# Patient Record
Sex: Female | Born: 1975 | Race: Black or African American | Hispanic: No | Marital: Single | Smoking: Current every day smoker
Health system: Southern US, Community
[De-identification: ages and names within clinical notes are randomized; demographics above are authoritative.]

---

## 2003-05-19 ENCOUNTER — Emergency Department (HOSPITAL_COMMUNITY): Admission: EM | Admit: 2003-05-19 | Discharge: 2003-05-19 | Payer: Self-pay | Admitting: Emergency Medicine

## 2004-01-30 ENCOUNTER — Emergency Department (HOSPITAL_COMMUNITY): Admission: EM | Admit: 2004-01-30 | Discharge: 2004-01-30 | Payer: Self-pay | Admitting: Emergency Medicine

## 2004-06-11 ENCOUNTER — Emergency Department (HOSPITAL_COMMUNITY): Admission: EM | Admit: 2004-06-11 | Discharge: 2004-06-11 | Payer: Self-pay | Admitting: Emergency Medicine

## 2004-06-12 ENCOUNTER — Encounter: Admission: RE | Admit: 2004-06-12 | Discharge: 2004-06-12 | Payer: Self-pay | Admitting: Emergency Medicine

## 2004-06-24 ENCOUNTER — Encounter: Payer: Self-pay | Admitting: Emergency Medicine

## 2004-06-24 ENCOUNTER — Encounter: Admission: RE | Admit: 2004-06-24 | Discharge: 2004-06-24 | Payer: Self-pay | Admitting: Pediatrics

## 2004-07-14 ENCOUNTER — Emergency Department (HOSPITAL_COMMUNITY): Admission: EM | Admit: 2004-07-14 | Discharge: 2004-07-14 | Payer: Self-pay | Admitting: Emergency Medicine

## 2006-04-04 ENCOUNTER — Emergency Department (HOSPITAL_COMMUNITY): Admission: EM | Admit: 2006-04-04 | Discharge: 2006-04-04 | Payer: Self-pay | Admitting: Family Medicine

## 2006-07-27 ENCOUNTER — Emergency Department (HOSPITAL_COMMUNITY): Admission: EM | Admit: 2006-07-27 | Discharge: 2006-07-27 | Payer: Self-pay | Admitting: Emergency Medicine

## 2007-03-06 ENCOUNTER — Emergency Department (HOSPITAL_COMMUNITY): Admission: EM | Admit: 2007-03-06 | Discharge: 2007-03-06 | Payer: Self-pay | Admitting: Family Medicine

## 2007-03-11 ENCOUNTER — Emergency Department (HOSPITAL_COMMUNITY): Admission: EM | Admit: 2007-03-11 | Discharge: 2007-03-11 | Payer: Self-pay | Admitting: Family Medicine

## 2007-10-20 ENCOUNTER — Encounter: Admission: RE | Admit: 2007-10-20 | Discharge: 2007-10-20 | Payer: Self-pay | Admitting: Emergency Medicine

## 2008-02-10 ENCOUNTER — Emergency Department (HOSPITAL_COMMUNITY): Admission: EM | Admit: 2008-02-10 | Discharge: 2008-02-10 | Payer: Self-pay | Admitting: Emergency Medicine

## 2008-07-07 ENCOUNTER — Emergency Department (HOSPITAL_COMMUNITY): Admission: EM | Admit: 2008-07-07 | Discharge: 2008-07-07 | Payer: Self-pay | Admitting: Emergency Medicine

## 2008-07-08 ENCOUNTER — Ambulatory Visit: Payer: Self-pay | Admitting: *Deleted

## 2008-07-08 ENCOUNTER — Inpatient Hospital Stay (HOSPITAL_COMMUNITY): Admission: AD | Admit: 2008-07-08 | Discharge: 2008-07-11 | Payer: Self-pay | Admitting: *Deleted

## 2009-09-02 ENCOUNTER — Emergency Department (HOSPITAL_COMMUNITY): Admission: EM | Admit: 2009-09-02 | Discharge: 2009-09-03 | Payer: Self-pay | Admitting: Emergency Medicine

## 2010-05-13 ENCOUNTER — Emergency Department (HOSPITAL_COMMUNITY): Admission: EM | Admit: 2010-05-13 | Discharge: 2010-05-13 | Payer: Self-pay | Admitting: Emergency Medicine

## 2010-11-19 LAB — URINE CULTURE
Colony Count: >=100000
Culture  Setup Time: 201109072310

## 2010-11-19 LAB — POCT URINALYSIS DIPSTICK
Bilirubin Urine: NEGATIVE
Glucose, UA: NEGATIVE mg/dL
Ketones, ur: NEGATIVE mg/dL
Nitrite: POSITIVE — AB
Protein, ur: NEGATIVE mg/dL
Specific Gravity, Urine: 1.01 (ref 1.005–1.030)
Urobilinogen, UA: 1 mg/dL (ref 0.0–1.0)
pH: 5 (ref 5.0–8.0)

## 2010-12-07 LAB — RAPID URINE DRUG SCREEN, HOSP PERFORMED
Amphetamines: POSITIVE — AB
Barbiturates: NOT DETECTED
Benzodiazepines: NOT DETECTED
Cocaine: POSITIVE — AB
Opiates: NOT DETECTED
Tetrahydrocannabinol: POSITIVE — AB

## 2010-12-07 LAB — URINE MICROSCOPIC-ADD ON

## 2010-12-07 LAB — CBC
HCT: 42.3 % (ref 36.0–46.0)
Hemoglobin: 14.4 g/dL (ref 12.0–15.0)
MCHC: 34 g/dL (ref 30.0–36.0)
MCV: 94.1 fL (ref 78.0–100.0)
Platelets: 347 10*3/uL (ref 150–400)
RBC: 4.5 MIL/uL (ref 3.87–5.11)
RDW: 14.4 % (ref 11.5–15.5)
WBC: 14.2 10*3/uL — ABNORMAL HIGH (ref 4.0–10.5)

## 2010-12-07 LAB — COMPREHENSIVE METABOLIC PANEL
ALT: 16 U/L (ref 0–35)
AST: 30 U/L (ref 0–37)
Albumin: 4.3 g/dL (ref 3.5–5.2)
Alkaline Phosphatase: 84 U/L (ref 39–117)
BUN: 7 mg/dL (ref 6–23)
CO2: 20 mEq/L (ref 19–32)
Calcium: 9.4 mg/dL (ref 8.4–10.5)
Chloride: 106 mEq/L (ref 96–112)
Creatinine, Ser: 1 mg/dL (ref 0.4–1.2)
GFR calc Af Amer: >60 mL/min (ref >60)
GFR calc non Af Amer: >60 mL/min (ref >60)
Glucose, Bld: 138 mg/dL — ABNORMAL HIGH (ref 70–99)
Potassium: 3.7 mEq/L (ref 3.5–5.1)
Sodium: 139 mEq/L (ref 135–145)
Total Bilirubin: 0.3 mg/dL (ref 0.3–1.2)
Total Protein: 8.3 g/dL (ref 6.0–8.3)

## 2010-12-07 LAB — URINALYSIS, ROUTINE W REFLEX MICROSCOPIC
Bilirubin Urine: NEGATIVE
Glucose, UA: NEGATIVE mg/dL
Ketones, ur: NEGATIVE mg/dL
Leukocytes, UA: NEGATIVE
Nitrite: NEGATIVE
Protein, ur: NEGATIVE mg/dL
Specific Gravity, Urine: 1.024 (ref 1.005–1.030)
Urobilinogen, UA: 0.2 mg/dL (ref 0.0–1.0)
pH: 6 (ref 5.0–8.0)

## 2010-12-07 LAB — DIFFERENTIAL
Basophils Absolute: 0 10*3/uL (ref 0.0–0.1)
Basophils Relative: 0 % (ref 0–1)
Eosinophils Absolute: 0 10*3/uL (ref 0.0–0.7)
Eosinophils Relative: 0 % (ref 0–5)
Lymphocytes Relative: 7 % — ABNORMAL LOW (ref 12–46)
Lymphs Abs: 0.9 10*3/uL (ref 0.7–4.0)
Monocytes Absolute: 0.5 10*3/uL (ref 0.1–1.0)
Monocytes Relative: 4 % (ref 3–12)
Neutro Abs: 12.8 10*3/uL — ABNORMAL HIGH (ref 1.7–7.7)
Neutrophils Relative %: 90 % — ABNORMAL HIGH (ref 43–77)

## 2010-12-07 LAB — ETHANOL: Alcohol, Ethyl (B): <5 mg/dL (ref 0–10)

## 2010-12-07 LAB — PREGNANCY, URINE: Preg Test, Ur: NEGATIVE
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
## 2011-05-11 ENCOUNTER — Inpatient Hospital Stay (INDEPENDENT_AMBULATORY_CARE_PROVIDER_SITE_OTHER)
Admission: RE | Admit: 2011-05-11 | Discharge: 2011-05-11 | Disposition: A | Discharge status: Home / Self Care | Payer: 59 | Source: Ambulatory Visit | Attending: Family Medicine | Admitting: Family Medicine

## 2011-05-11 DIAGNOSIS — N390 Urinary tract infection, site not specified: Secondary | ICD-10-CM

## 2011-05-11 LAB — POCT URINALYSIS DIP (DEVICE)
Glucose, UA: NEGATIVE mg/dL
Ketones, ur: NEGATIVE mg/dL
Nitrite: NEGATIVE
Specific Gravity, Urine: >=1.03 (ref 1.005–1.030)
Urobilinogen, UA: 2 mg/dL — ABNORMAL HIGH (ref 0.0–1.0)
pH: 6.5 (ref 5.0–8.0)

## 2011-05-11 LAB — URINE MICROSCOPIC-ADD ON

## 2011-05-11 LAB — POCT PREGNANCY, URINE: Preg Test, Ur: NEGATIVE

## 2011-05-11 LAB — URINALYSIS, ROUTINE W REFLEX MICROSCOPIC
Glucose, UA: NEGATIVE mg/dL
Ketones, ur: NEGATIVE mg/dL
Nitrite: NEGATIVE
Protein, ur: NEGATIVE mg/dL
Specific Gravity, Urine: 1.028 (ref 1.005–1.030)
Urobilinogen, UA: 1 mg/dL (ref 0.0–1.0)
pH: 6 (ref 5.0–8.0)

## 2011-05-13 LAB — URINE CULTURE
Colony Count: >=100000
Culture  Setup Time: 201209042253

## 2011-06-03 LAB — URINALYSIS, ROUTINE W REFLEX MICROSCOPIC
Bilirubin Urine: NEGATIVE
Glucose, UA: NEGATIVE
Ketones, ur: NEGATIVE
Nitrite: NEGATIVE
Protein, ur: NEGATIVE
Specific Gravity, Urine: 1.026
Urobilinogen, UA: 1
pH: 6.5

## 2011-06-03 LAB — POCT PREGNANCY, URINE
Operator id: 264031
Preg Test, Ur: NEGATIVE

## 2011-06-03 LAB — URINE MICROSCOPIC-ADD ON

## 2011-06-03 LAB — GC/CHLAMYDIA PROBE AMP, GENITAL
Chlamydia, DNA Probe: NEGATIVE
GC Probe Amp, Genital: NEGATIVE

## 2011-06-03 LAB — WET PREP, GENITAL

## 2011-06-08 LAB — DIFFERENTIAL
Eosinophils Absolute: 0
Lymphs Abs: 0.9
Monocytes Relative: 2 — ABNORMAL LOW
Neutro Abs: 9.3 — ABNORMAL HIGH
Neutrophils Relative %: 89 — ABNORMAL HIGH

## 2011-06-08 LAB — URINALYSIS, ROUTINE W REFLEX MICROSCOPIC
Glucose, UA: NEGATIVE
Leukocytes, UA: NEGATIVE
Protein, ur: NEGATIVE
Specific Gravity, Urine: 1.026
Urobilinogen, UA: 0.2

## 2011-06-08 LAB — URINE MICROSCOPIC-ADD ON

## 2011-06-08 LAB — CBC
MCV: 92.4
Platelets: 342
RBC: 4.56
WBC: 10.4

## 2011-06-08 LAB — POCT I-STAT, CHEM 8
HCT: 43
Hemoglobin: 14.6
Potassium: 4
Sodium: 142
TCO2: 22

## 2011-06-08 LAB — RAPID URINE DRUG SCREEN, HOSP PERFORMED
Benzodiazepines: NOT DETECTED
Cocaine: POSITIVE — AB
Tetrahydrocannabinol: POSITIVE — AB

## 2011-06-08 LAB — ETHANOL: Alcohol, Ethyl (B): 5

## 2011-06-23 LAB — POCT URINALYSIS DIP (DEVICE)
Bilirubin Urine: NEGATIVE
Glucose, UA: NEGATIVE
Ketones, ur: NEGATIVE
Nitrite: NEGATIVE
Operator id: 247071
Protein, ur: NEGATIVE
Specific Gravity, Urine: 1.025
Urobilinogen, UA: 0.2
pH: 6.5

## 2011-06-23 LAB — POCT PREGNANCY, URINE
Operator id: 247071
Preg Test, Ur: NEGATIVE

## 2011-06-23 LAB — CULTURE, ROUTINE-ABSCESS

## 2011-06-23 LAB — URINE CULTURE: Colony Count: 70000

## 2011-12-20 IMAGING — CR DG CHEST 2V
2 series · 2 of 2 positions shown · non-contrast
Comparison: None.

CLINICAL DATA: Fever, flu like symptoms

CHEST - 2 VIEW

[view not recorded (1 of 2)]
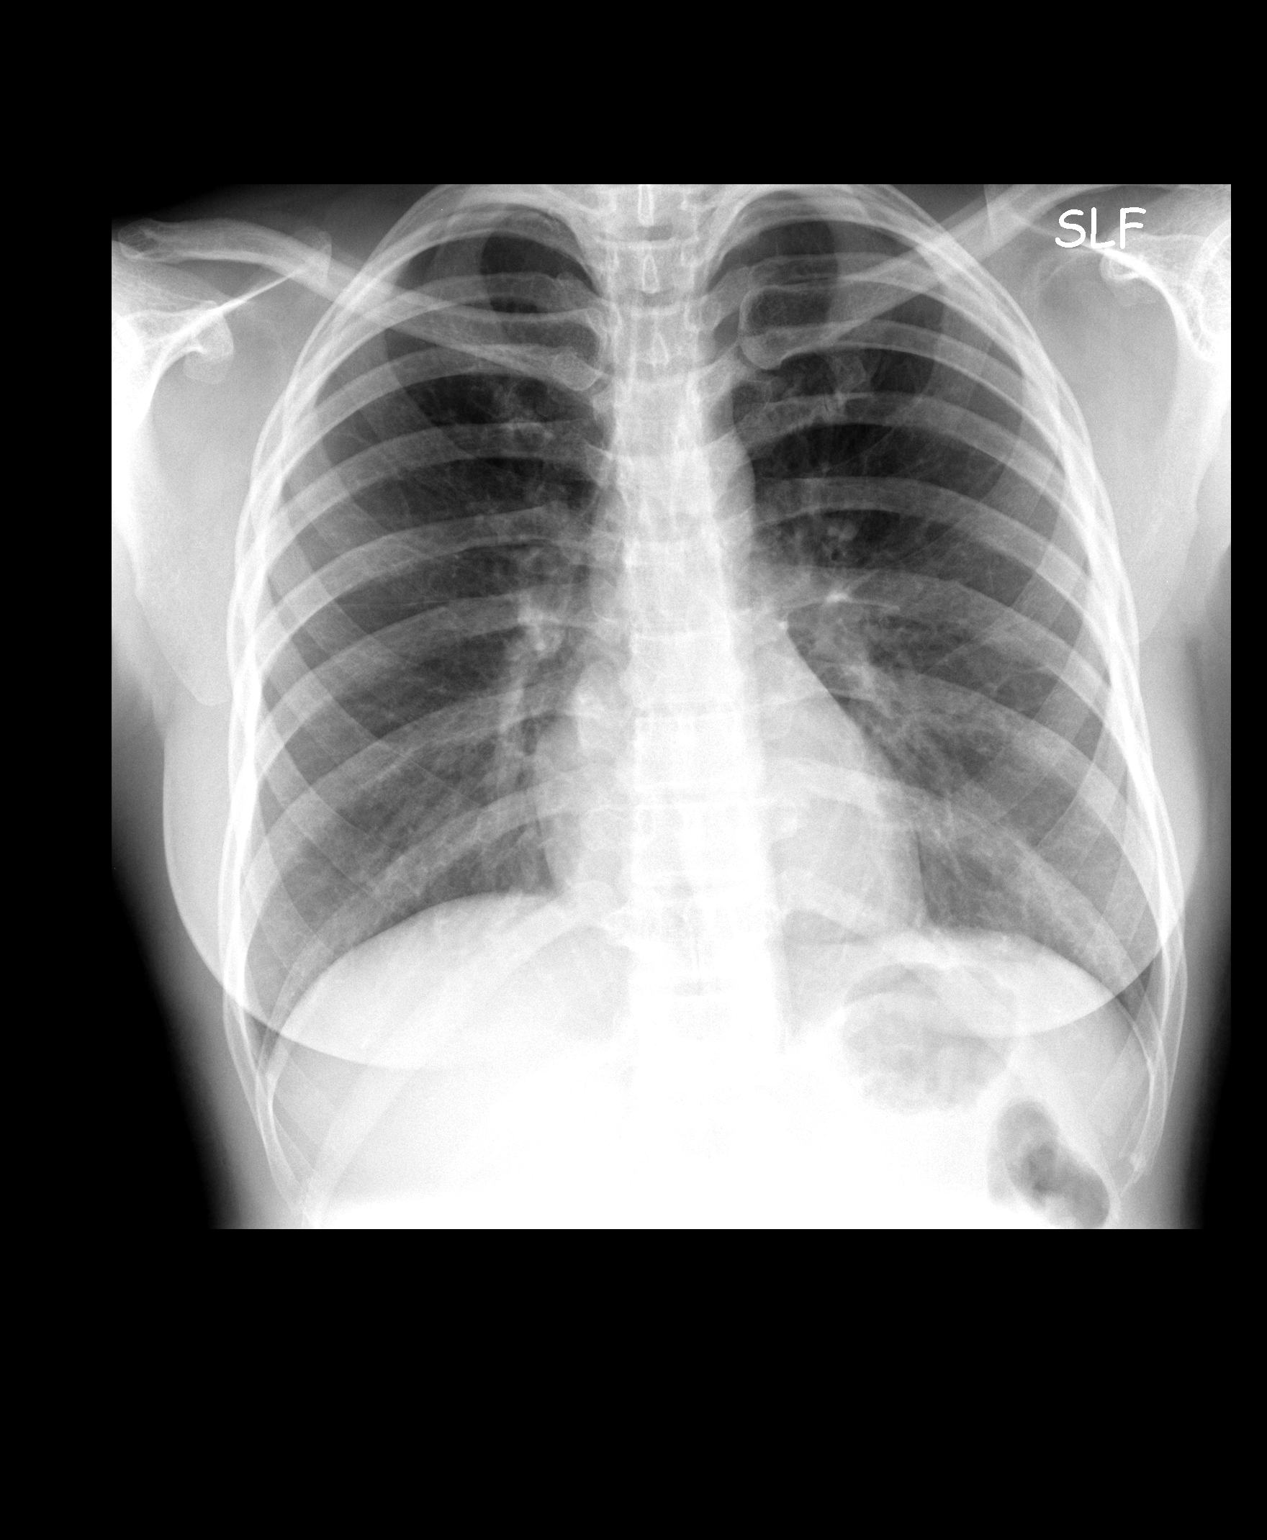

[view not recorded (2 of 2)]
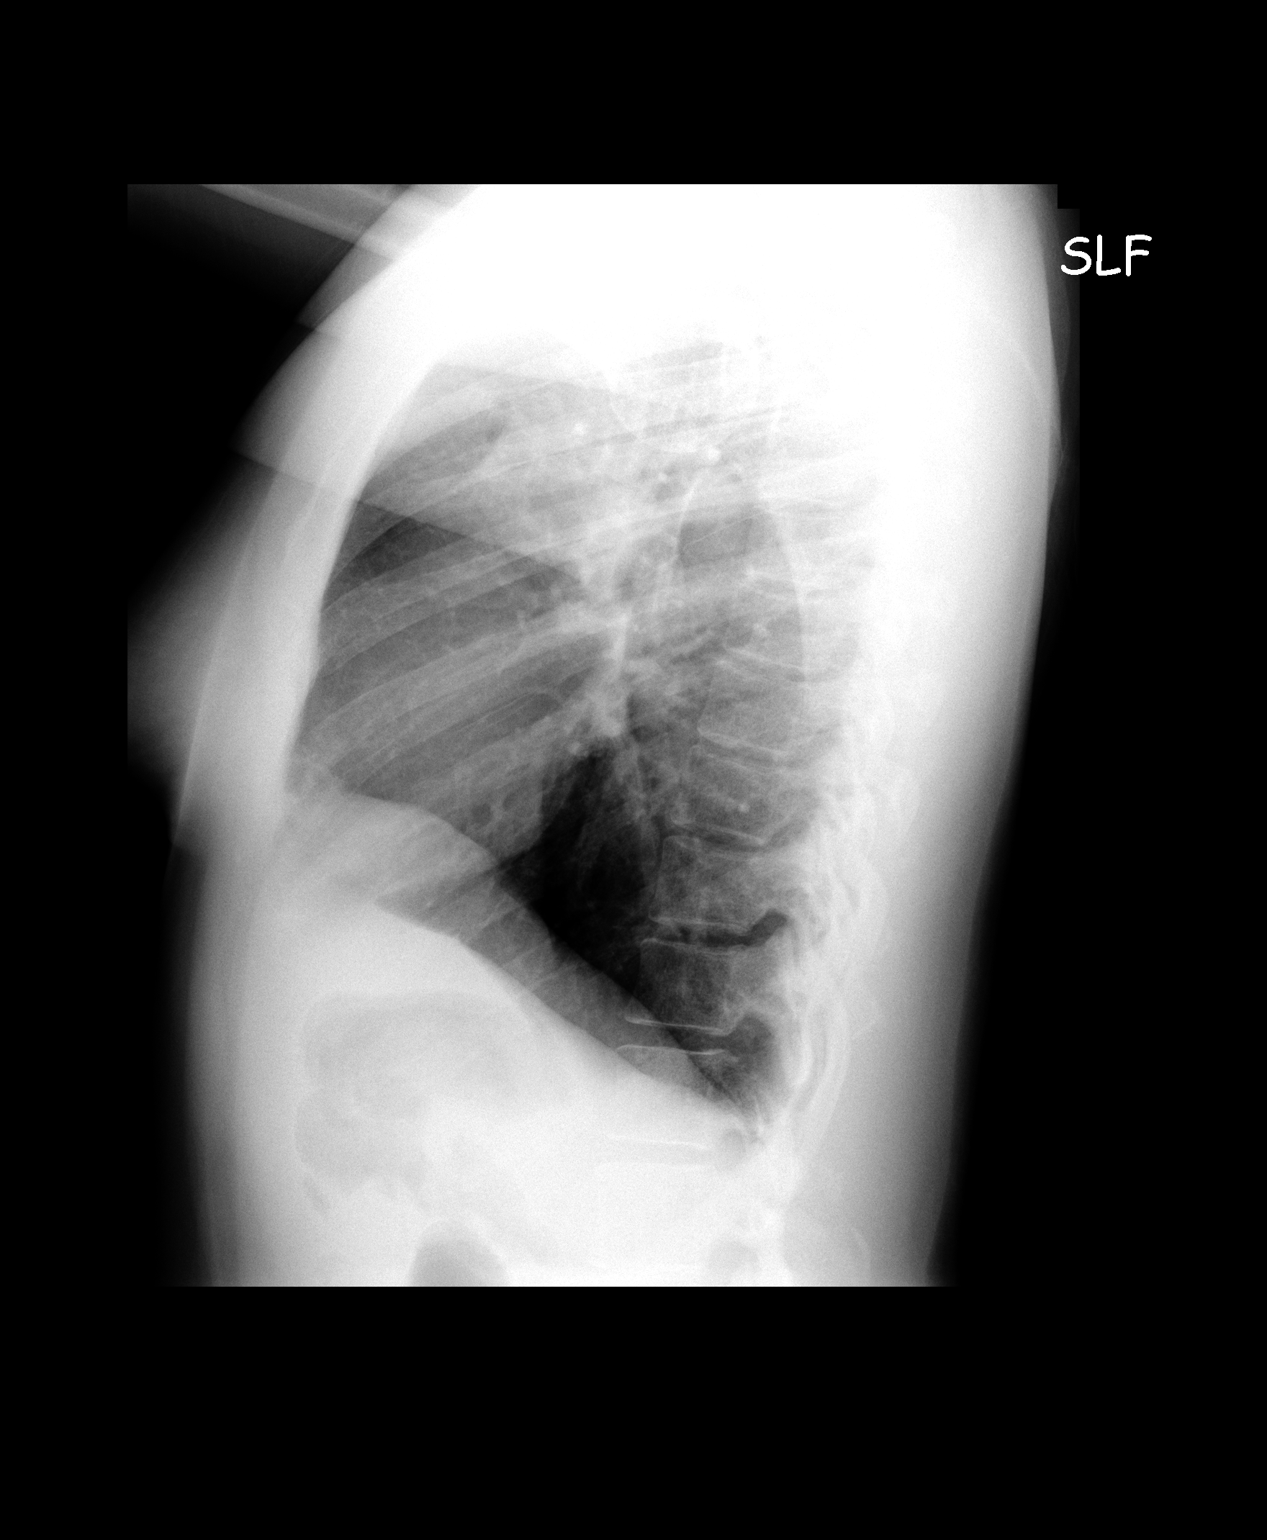

[2 of 2 positions shown; findings below may reference images not displayed]

FINDINGS: Cardiomediastinal silhouette is within normal limits. The
lungs are clear. No pleural effusion.  No pneumothorax.  No acute
osseous abnormality.
IMPRESSION: Normal chest.

## 2012-03-27 ENCOUNTER — Emergency Department (INDEPENDENT_AMBULATORY_CARE_PROVIDER_SITE_OTHER)
Admission: EM | Admit: 2012-03-27 | Discharge: 2012-03-27 | Disposition: A | Discharge status: Home / Self Care | Payer: Self-pay | Source: Home / Self Care

## 2012-03-27 ENCOUNTER — Encounter (HOSPITAL_COMMUNITY): Payer: Self-pay | Admitting: Emergency Medicine

## 2012-03-27 DIAGNOSIS — K0889 Other specified disorders of teeth and supporting structures: Secondary | ICD-10-CM

## 2012-03-27 DIAGNOSIS — K089 Disorder of teeth and supporting structures, unspecified: Secondary | ICD-10-CM

## 2012-03-27 DIAGNOSIS — S025XXA Fracture of tooth (traumatic), initial encounter for closed fracture: Secondary | ICD-10-CM

## 2012-03-27 MED ORDER — PENICILLIN V POTASSIUM 500 MG PO TABS: 500.0000 mg | ORAL_TABLET | Freq: Three times a day (TID) | ORAL | Status: AC

## 2012-03-27 MED ORDER — OXYCODONE-ACETAMINOPHEN 5-325 MG PO TABS: 2.0000 | ORAL_TABLET | ORAL | Status: AC | PRN

## 2012-03-27 MED ORDER — IBUPROFEN 600 MG PO TABS: 600.0000 mg | ORAL_TABLET | Freq: Four times a day (QID) | ORAL | Status: AC | PRN

## 2012-03-27 NOTE — ED Provider Notes (Signed)
History     CSN: [id number]  Arrival date & time 03/27/12  1833   None     Chief Complaint  Patient presents with  . Dental Pain    (Consider location/radiation/quality/duration/timing/severity/associated sxs/prior treatment) Patient is a 36 y.o. female presenting with tooth pain. The history is provided by the patient.  Dental PainThe primary symptoms include dental injury. The symptoms began 5 to 7 days ago. The symptoms are worsening.  The dental injury occurred more than 1 week ago. Affected teeth include: 4/right upper second bicuspid. The injury is a fracture. Mechanism of dental injury: biting down into, previous cavity filling in same tooth, reports it has been chipping over two weeks.  Additional symptoms include: dental sensitivity to temperature, gum tenderness and jaw pain. Additional symptoms do not include: trismus, facial swelling, trouble swallowing and ear pain. Medical issues include: periodontal disease.    History reviewed. No pertinent past medical history.  History reviewed. No pertinent past surgical history.  No family history on file.  History  Substance Use Topics  . Smoking status: Current Everyday Smoker    Types: Cigarettes  . Smokeless tobacco: Not on file  . Alcohol Use: Yes    OB History    Grav Para Term Preterm Abortions TAB SAB Ect Mult Living                  Review of Systems  HENT: Negative for ear pain, facial swelling and trouble swallowing.   All other systems reviewed and are negative.    Allergies  Review of patient's allergies indicates no known allergies.  Home Medications   Current Outpatient Rx  Name Route Sig Dispense Refill  . IBUPROFEN 600 MG PO TABS Oral Take 1 tablet (600 mg total) by mouth every 6 (six) hours as needed for pain. 30 tablet 0  . OXYCODONE-ACETAMINOPHEN 5-325 MG PO TABS Oral Take 2 tablets by mouth every 4 (four) hours as needed for pain. 6 tablet 0  . PENICILLIN V POTASSIUM 500 MG PO TABS Oral  Take 1 tablet (500 mg total) by mouth 3 (three) times daily. 21 tablet 0    BP 118/55  Pulse 83  Temp 99 F (37.2 C) (Oral)  Resp 18  SpO2 100%  LMP 03/07/2012  Physical Exam  Nursing note and vitals reviewed. Constitutional: She is oriented to person, place, and time. Vital signs are normal. She appears well-developed and well-nourished. She is active and cooperative.  HENT:  Head: Normocephalic. No trismus in the jaw.  Right Ear: Hearing, tympanic membrane, external ear and ear canal normal.  Left Ear: Hearing, tympanic membrane, external ear and ear canal normal.  Nose: Nose normal. Right sinus exhibits no maxillary sinus tenderness. Left sinus exhibits no maxillary sinus tenderness.  Mouth/Throat: Uvula is midline, oropharynx is clear and moist and mucous membranes are normal. Abnormal dentition. Dental caries present. No dental abscesses, uvula swelling or lacerations.         Tooth fracture and partial tooth  Eyes: Conjunctivae are normal. Pupils are equal, round, and reactive to light. No scleral icterus.  Neck: Trachea normal. Neck supple.  Cardiovascular: Normal rate and regular rhythm.   Pulmonary/Chest: Effort normal and breath sounds normal.  Lymphadenopathy:       Head (right side): No tonsillar adenopathy present.       Head (left side): No tonsillar adenopathy present.    She has no cervical adenopathy.  Neurological: She is alert and oriented to person, place, and time.  No cranial nerve deficit or sensory deficit.  Skin: Skin is warm and dry.  Psychiatric: She has a normal mood and affect. Her speech is normal and behavior is normal. Judgment and thought content normal. Cognition and memory are normal.    ED Course  Procedures (including critical care time)  Labs Reviewed - No data to display No results found.   1. Avulsion fracture of tooth   2. Pain, dental       MDM  You will need to be evaluated by dentist as soon as possible, your tooth will have  to be removed.  Dental reference list provided.  Take medication as prescribed.          Johnsie Kindred, NP 03/27/12 2142

## 2012-03-27 NOTE — ED Notes (Signed)
Pt. Stated, My tooth on the rt. Split and broke off now its in pain  Since last week.

## 2012-03-28 NOTE — ED Provider Notes (Signed)
Medical screening examination/treatment/procedure(s) were performed by non-physician practitioner and as supervising physician I was immediately available for consultation/collaboration.  Leslee Home, M.D.   Reuben Likes, MD 03/28/12 1055

## 2014-01-02 ENCOUNTER — Encounter (HOSPITAL_COMMUNITY): Payer: Self-pay | Admitting: Emergency Medicine

## 2014-01-02 ENCOUNTER — Emergency Department (INDEPENDENT_AMBULATORY_CARE_PROVIDER_SITE_OTHER)
Admission: EM | Admit: 2014-01-02 | Discharge: 2014-01-02 | Disposition: A | Discharge status: Home / Self Care | Payer: Self-pay | Source: Home / Self Care | Attending: Family Medicine | Admitting: Family Medicine

## 2014-01-02 DIAGNOSIS — M546 Pain in thoracic spine: Secondary | ICD-10-CM

## 2014-01-02 MED ORDER — DICLOFENAC SODIUM 50 MG PO TBEC: 50.0000 mg | DELAYED_RELEASE_TABLET | Freq: Two times a day (BID) | ORAL | Status: DC | PRN

## 2014-01-02 MED ORDER — CYCLOBENZAPRINE HCL 5 MG PO TABS: 5.0000 mg | ORAL_TABLET | Freq: Every evening | ORAL | Status: DC | PRN

## 2014-01-02 NOTE — ED Notes (Signed)
Pt c/o back pain onset 1 week Denies inj/traum, BM/bladder problems Pain increases w/activity and eases when at rest Alert w/no signs of acute distress.

## 2014-01-02 NOTE — ED Provider Notes (Signed)
[name] is a 38 y.o. female who presents to Urgent Care today for low back pain. Patient is 4 days of right-sided low thoracic back pain. The pain is worse with rotation. No radiating pain weakness or numbness bowel bladder dysfunction or difficulty walking. No medications tried. Patient feels well otherwise. Pain is moderate and worse with activity.   History reviewed. No pertinent past medical history. History  Substance Use Topics  . Smoking status: Current Every Day Smoker    Types: Cigarettes  . Smokeless tobacco: Not on file  . Alcohol Use: Yes   ROS as above Medications: No current facility-administered medications for this encounter.   Current Outpatient Prescriptions  Medication Sig Dispense Refill  . cyclobenzaprine (FLEXERIL) 5 MG tablet Take 1 tablet (5 mg total) by mouth at bedtime as needed for muscle spasms.  20 tablet  0  . diclofenac (VOLTAREN) 50 MG EC tablet Take 1 tablet (50 mg total) by mouth 2 (two) times daily as needed.  60 tablet  0    Exam:  BP 121/76  Pulse 83  Temp(Src) 98.6 F (37 C) (Oral)  Resp 18  SpO2 99%  LMP 12/14/2013 Gen: Well NAD Back: Nontender to spinal midline. Tender palpation to right thoracic paraspinal. Neck range of motion intact flexion and extension. Pain with rotation. Lower extremity strength is intact throughout. Negative for leg raise test and [facility] bilaterally. Reflexes are intact throughout. Normal gait  No results found for this or any previous visit (from the past 24 hour(s)). No results found.  Assessment and Plan: 38 y.o. female with myofascial disruption of the thoracic paraspinals. Plan to treat with diclofenac and Flexeril. Additionally heating pad and home exercise program.  Discussed warning signs or symptoms. Please see discharge instructions. Patient expresses understanding.    Rodolph BongEvan S Annaelle Kasel, MD 01/02/14 2009

## 2014-01-02 NOTE — Discharge Instructions (Signed)
Thank you for coming in today. °Come back or go to the emergency room if you notice new weakness new numbness problems walking or bowel or bladder problems. ° °Back Exercises °These exercises may help you when beginning to rehabilitate your injury. Your symptoms may resolve with or without further involvement from your physician, physical therapist or athletic trainer. While completing these exercises, remember:  °· Restoring tissue flexibility helps normal motion to return to the joints. This allows healthier, less painful movement and activity. °· An effective stretch should be held for at least 30 seconds. °· A stretch should never be painful. You should only feel a gentle lengthening or release in the stretched tissue. °STRETCH  Extension, Prone on Elbows  °· Lie on your stomach on the floor, a bed will be too soft. Place your palms about shoulder width apart and at the height of your head. °· Place your elbows under your shoulders. If this is too painful, stack pillows under your chest. °· Allow your body to relax so that your hips drop lower and make contact more completely with the floor. °· Hold this position for __________ seconds. °· Slowly return to lying flat on the floor. °Repeat __________ times. Complete this exercise __________ times per day.  °RANGE OF MOTION  Extension, Prone Press Ups  °· Lie on your stomach on the floor, a bed will be too soft. Place your palms about shoulder width apart and at the height of your head. °· Keeping your back as relaxed as possible, slowly straighten your elbows while keeping your hips on the floor. You may adjust the placement of your hands to maximize your comfort. As you gain motion, your hands will come more underneath your shoulders. °· Hold this position __________ seconds. °· Slowly return to lying flat on the floor. °Repeat __________ times. Complete this exercise __________ times per day.  °RANGE OF MOTION- Quadruped, Neutral Spine  °· Assume a hands and  knees position on a firm surface. Keep your hands under your shoulders and your knees under your hips. You may place padding under your knees for comfort. °· Drop your head and point your tail bone toward the ground below you. This will round out your low back like an angry cat. Hold this position for __________ seconds. °· Slowly lift your head and release your tail bone so that your back sags into a large arch, like an old horse. °· Hold this position for __________ seconds. °· Repeat this until you feel limber in your low back. °· Now, find your "sweet spot." This will be the most comfortable position somewhere between the two previous positions. This is your neutral spine. Once you have found this position, tense your stomach muscles to support your low back. °· Hold this position for __________ seconds. °Repeat __________ times. Complete this exercise __________ times per day.  °STRETCH  Flexion, Single Knee to Chest  °· Lie on a firm bed or floor with both legs extended in front of you. °· Keeping one leg in contact with the floor, bring your opposite knee to your chest. Hold your leg in place by either grabbing behind your thigh or at your knee. °· Pull until you feel a gentle stretch in your low back. Hold __________ seconds. °· Slowly release your grasp and repeat the exercise with the opposite side. °Repeat __________ times. Complete this exercise __________ times per day.  °STRETCH - Hamstrings, Standing °· Stand or sit and extend your right / left leg,   placing your foot on a chair or foot stool °· Keeping a slight arch in your low back and your hips straight forward. °· Lead with your chest and lean forward at the waist until you feel a gentle stretch in the back of your right / left knee or thigh. (When done correctly, this exercise requires leaning only a small distance.) °· Hold this position for __________ seconds. °Repeat __________ times. Complete this stretch __________ times per  day. °STRENGTHENING  Deep Abdominals, Pelvic Tilt  °· Lie on a firm bed or floor. Keeping your legs in front of you, bend your knees so they are both pointed toward the ceiling and your feet are flat on the floor. °· Tense your lower abdominal muscles to press your low back into the floor. This motion will rotate your pelvis so that your tail bone is scooping upwards rather than pointing at your feet or into the floor. °· With a gentle tension and even breathing, hold this position for __________ seconds. °Repeat __________ times. Complete this exercise __________ times per day.  °STRENGTHENING  Abdominals, Crunches  °· Lie on a firm bed or floor. Keeping your legs in front of you, bend your knees so they are both pointed toward the ceiling and your feet are flat on the floor. Cross your arms over your chest. °· Slightly tip your chin down without bending your neck. °· Tense your abdominals and slowly lift your trunk high enough to just clear your shoulder blades. Lifting higher can put excessive stress on the low back and does not further strengthen your abdominal muscles. °· Control your return to the starting position. °Repeat __________ times. Complete this exercise __________ times per day.  °STRENGTHENING  Quadruped, Opposite UE/LE Lift  °· Assume a hands and knees position on a firm surface. Keep your hands under your shoulders and your knees under your hips. You may place padding under your knees for comfort. °· Find your neutral spine and gently tense your abdominal muscles so that you can maintain this position. Your shoulders and hips should form a rectangle that is parallel with the floor and is not twisted. °· Keeping your trunk steady, lift your right hand no higher than your shoulder and then your left leg no higher than your hip. Make sure you are not holding your breath. Hold this position __________ seconds. °· Continuing to keep your abdominal muscles tense and your back steady, slowly return to  your starting position. Repeat with the opposite arm and leg. °Repeat __________ times. Complete this exercise __________ times per day. °Document Released: 09/10/2005 Document Revised: 11/15/2011 Document Reviewed: 12/05/2008 °[employer]® Patient Information ©2014 [employer]. ° °

## 2017-04-30 ENCOUNTER — Encounter (HOSPITAL_COMMUNITY): Payer: Self-pay | Admitting: Emergency Medicine

## 2017-04-30 ENCOUNTER — Emergency Department (HOSPITAL_COMMUNITY)
Admission: EM | Admit: 2017-04-30 | Discharge: 2017-04-30 | Disposition: A | Discharge status: Home / Self Care | Payer: Self-pay | Attending: Emergency Medicine | Admitting: Emergency Medicine

## 2017-04-30 DIAGNOSIS — Z5321 Procedure and treatment not carried out due to patient leaving prior to being seen by health care provider: Secondary | ICD-10-CM | POA: Insufficient documentation

## 2017-04-30 MED ORDER — IBUPROFEN 400 MG PO TABS
400.0000 mg | ORAL_TABLET | Freq: Once | ORAL | Status: AC | PRN
  Administered 2017-04-30: 400 mg via ORAL

## 2017-04-30 MED ORDER — IBUPROFEN 400 MG PO TABS
ORAL_TABLET | ORAL | Status: AC
  Filled 2017-04-30: qty 1
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
## 2017-04-30 NOTE — ED Notes (Signed)
Patient up to desk.   Provided stickers to Registration and stated she would be leaving.  Will d/c.

## 2017-05-02 ENCOUNTER — Encounter (HOSPITAL_COMMUNITY): Payer: Self-pay | Admitting: Emergency Medicine

## 2017-05-02 ENCOUNTER — Ambulatory Visit (HOSPITAL_COMMUNITY)
Admission: EM | Admit: 2017-05-02 | Discharge: 2017-05-02 | Disposition: A | Discharge status: Home / Self Care | Payer: Self-pay | Attending: Emergency Medicine | Admitting: Emergency Medicine

## 2017-05-02 DIAGNOSIS — T23241A Burn of second degree of multiple right fingers (nail), including thumb, initial encounter: Secondary | ICD-10-CM

## 2017-05-02 MED ORDER — SILVER SULFADIAZINE 1 % EX CREA: 1.0000 "application " | TOPICAL_CREAM | Freq: Every day | CUTANEOUS | 0 refills | Status: AC

## 2017-05-02 MED ORDER — SILVER SULFADIAZINE 1 % EX CREA
TOPICAL_CREAM | Freq: Once | CUTANEOUS | Status: AC
  Administered 2017-05-02: 17:00:00 via TOPICAL

## 2017-05-02 MED ORDER — SILVER SULFADIAZINE 1 % EX CREA
TOPICAL_CREAM | CUTANEOUS | Status: AC
  Filled 2017-05-02: qty 85

## 2017-05-02 NOTE — Discharge Instructions (Signed)
Keep your wound clean and dry, apply Silvadene once daily, I have included the contact information for wound care specialist, contact their office to set up an appointment for follow-up care and monitoring as needed. If you seen any signs or symptoms of infection, return to clinic as needed

## 2017-05-02 NOTE — ED Notes (Signed)
Silvadene placed to right posterior thumb and point finger. Dressed with sterile supplies. Patient given supplies for home dressing changes. Signs and symptoms of infection discussed.
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
## 2020-11-06 ENCOUNTER — Ambulatory Visit (HOSPITAL_COMMUNITY)
Admission: EM | Admit: 2020-11-06 | Discharge: 2020-11-06 | Disposition: A | Discharge status: Home / Self Care | Payer: Self-pay | Attending: Family Medicine | Admitting: Family Medicine

## 2020-11-06 ENCOUNTER — Other Ambulatory Visit: Payer: Self-pay

## 2020-11-06 ENCOUNTER — Encounter (HOSPITAL_COMMUNITY): Payer: Self-pay | Admitting: Emergency Medicine

## 2020-11-06 DIAGNOSIS — N390 Urinary tract infection, site not specified: Secondary | ICD-10-CM | POA: Insufficient documentation

## 2020-11-06 LAB — POCT URINALYSIS DIPSTICK, ED / UC
Bilirubin Urine: NEGATIVE
Glucose, UA: NEGATIVE mg/dL
Ketones, ur: NEGATIVE mg/dL
Leukocytes,Ua: NEGATIVE
Nitrite: POSITIVE — AB
Protein, ur: NEGATIVE mg/dL
Specific Gravity, Urine: >=1.03 (ref 1.005–1.030)
Urobilinogen, UA: 0.2 mg/dL (ref 0.0–1.0)
pH: 6.5 (ref 5.0–8.0)

## 2020-11-06 LAB — POC URINE PREG, ED: Preg Test, Ur: NEGATIVE

## 2020-11-06 MED ORDER — CEPHALEXIN 500 MG PO CAPS: 500.0000 mg | ORAL_CAPSULE | Freq: Two times a day (BID) | ORAL | 0 refills | Status: AC

## 2020-11-06 NOTE — ED Provider Notes (Signed)
[facility]    CSN: [id number] Arrival date & time: 11/06/20  1607      History   Chief Complaint Chief Complaint  Patient presents with  . Abdominal Pain    lower    HPI [name] is a 45 y.o. female.   Patient presents with malodorous urine and lower abdominal pressure x2 weeks.  She states her symptoms have greatly improved.  She denies fever, dysuria, hematuria, vomiting, diarrhea, back pain, vaginal discharge, pelvic pain, or other symptoms.  No treatments attempted at home.  She denies pertinent medical history.  The history is provided by the patient.    History reviewed. No pertinent past medical history.  There are no problems to display for this patient.   History reviewed. No pertinent surgical history.  OB History   No obstetric history on file.      Home Medications    Prior to Admission medications   Medication Sig Start Date End Date Taking? Authorizing Provider  cephALEXin (KEFLEX) 500 MG capsule Take 1 capsule (500 mg total) by mouth 2 (two) times daily for 5 days. 11/06/20 11/11/20 Yes Mickie Bail, NP  silver sulfADIAZINE (SILVADENE) 1 % cream Apply 1 application topically daily. 05/02/17   Dorena Bodo, NP    Family History Family History  Problem Relation Age of Onset  . Healthy Mother   . Healthy Father     Social History Social History   Tobacco Use  . Smoking status: Current Every Day Smoker    Types: Cigarettes  . Smokeless tobacco: Never Used  Substance Use Topics  . Alcohol use: Yes  . Drug use: No     Allergies   Patient has no known allergies.   Review of Systems Review of Systems  Constitutional: Negative for chills and fever.  HENT: Negative for ear pain and sore throat.   Eyes: Negative for pain and visual disturbance.  Respiratory: Negative for cough and shortness of breath.   Cardiovascular: Negative for chest pain and palpitations.  Gastrointestinal: Positive for abdominal pain. Negative  for constipation, diarrhea and vomiting.  Genitourinary: Negative for dysuria, hematuria, pelvic pain and vaginal discharge.       Malodorous urine  Musculoskeletal: Negative for arthralgias and back pain.  Skin: Negative for color change and rash.  Neurological: Negative for seizures and syncope.  All other systems reviewed and are negative.    Physical Exam Triage Vital Signs ED Triage Vitals [11/06/20 1637]  Enc Vitals Group     BP      Pulse      Resp      Temp      Temp src      SpO2      Weight 145 lb (65.8 kg)     Height 4\' 11"  (1.499 m)     Head Circumference      Peak Flow      Pain Score 0     Pain Loc      Pain Edu?      Excl. in GC?    No data found.  Updated Vital Signs BP 136/90 (BP Location: Right Arm)   Pulse 85   Temp 99.5 F (37.5 C) (Oral)   Resp 20   Ht 4\' 11"  (1.499 m)   Wt 145 lb (65.8 kg)   LMP 10/24/2020   SpO2 98%   BMI 29.29 kg/m   Visual Acuity Right Eye Distance:   Left Eye Distance:   Bilateral Distance:  Right Eye Near:   Left Eye Near:    Bilateral Near:     Physical Exam Vitals and nursing note reviewed.  Constitutional:      General: She is not in acute distress.    Appearance: She is well-developed and well-nourished. She is not ill-appearing.  HENT:     Head: Normocephalic and atraumatic.     Mouth/Throat:     Mouth: Mucous membranes are moist.  Eyes:     Conjunctiva/sclera: Conjunctivae normal.  Cardiovascular:     Rate and Rhythm: Normal rate and regular rhythm.     Heart sounds: Normal heart sounds.  Pulmonary:     Effort: Pulmonary effort is normal. No respiratory distress.     Breath sounds: Normal breath sounds.  Abdominal:     General: Bowel sounds are normal.     Palpations: Abdomen is soft.     Tenderness: There is no abdominal tenderness. There is no right CVA tenderness, left CVA tenderness, guarding or rebound.  Musculoskeletal:        General: No edema.     Cervical back: Neck supple.   Skin:    General: Skin is warm and dry.  Neurological:     General: No focal deficit present.     Mental Status: She is alert and oriented to person, place, and time.     Gait: Gait normal.  Psychiatric:        Mood and Affect: Mood and affect and mood normal.        Behavior: Behavior normal.      UC Treatments / Results  Labs (all labs ordered are listed, but only abnormal results are displayed) Labs Reviewed  POCT URINALYSIS DIPSTICK, ED / UC - Abnormal; Notable for the following components:      Result Value   Hgb urine dipstick SMALL (*)    Nitrite POSITIVE (*)    All other components within normal limits  URINE CULTURE  POC URINE PREG, ED    EKG   Radiology No results found.  Procedures Procedures (including critical care time)  Medications Ordered in UC Medications - No data to display  Initial Impression / Assessment and Plan / UC Course  I have reviewed the triage vital signs and the nursing notes.  Pertinent labs & imaging results that were available during my care of the patient were reviewed by me and considered in my medical decision making (see chart for details).   UTI.  Urine culture pending.  Treating with Keflex.  Discussed with patient that we will call her if the culture shows the need to change or discontinue her antibiotic.  Instructed her to follow-up with her PCP as needed.  She agrees to plan of care.   Final Clinical Impressions(s) / UC Diagnoses   Final diagnoses:  Urinary tract infection without hematuria, site unspecified     Discharge Instructions     Take the antibiotic as directed.  The urine culture is pending.  We will call you if it shows the need to change or discontinue your antibiotic.    Follow up with your primary care provider if your symptoms are not improving.        ED Prescriptions    Medication Sig Dispense Auth. Provider   cephALEXin (KEFLEX) 500 MG capsule Take 1 capsule (500 mg total) by mouth 2 (two)  times daily for 5 days. 10 capsule Mickie Bail, NP     PDMP not reviewed this encounter.   Arlana Pouch,  Fredrich Romans, NP 11/06/20 1701

## 2020-11-06 NOTE — Discharge Instructions (Signed)
Take the antibiotic as directed.  The urine culture is pending.  We will call you if it shows the need to change or discontinue your antibiotic.    Follow up with your primary care provider if your symptoms are not improving.    

## 2020-11-06 NOTE — ED Triage Notes (Signed)
Pt presents today with c/o of foul odor in urine approx 2 weeks ago, denies current pain, discharge or dysuria.

## 2020-11-08 LAB — URINE CULTURE

## 2020-11-09 LAB — URINE CULTURE: Culture: >=100000 — AB

## 2021-01-20 ENCOUNTER — Encounter: Payer: Self-pay | Admitting: Emergency Medicine

## 2021-01-20 ENCOUNTER — Other Ambulatory Visit: Payer: Self-pay

## 2021-01-20 ENCOUNTER — Ambulatory Visit
Admission: EM | Admit: 2021-01-20 | Discharge: 2021-01-20 | Disposition: A | Discharge status: Home / Self Care | Payer: Self-pay | Attending: Student | Admitting: Student

## 2021-01-20 DIAGNOSIS — R253 Fasciculation: Secondary | ICD-10-CM

## 2021-01-20 MED ORDER — TIZANIDINE HCL 2 MG PO CAPS: 2.0000 mg | ORAL_CAPSULE | Freq: Three times a day (TID) | ORAL | 0 refills | Status: AC

## 2021-01-20 NOTE — ED Triage Notes (Addendum)
Patient presents to [facility] for assessment of left eye twitching x 2 weeks.  States she has associated headache sometimes  States she has a stressful job but is sure to rest and relax when off work.  Patient states she has strong contractions to the lid that actually cause contortion when doing her makeup in the morning.

## 2021-01-20 NOTE — Discharge Instructions (Addendum)
-  Start the muscle relaxer-Zanaflex (tizanidine), up to 3 times daily for muscle spasms and pain.  This can make you drowsy, so take at bedtime or when you do not need to drive or operate machinery. -We are checking your electrolyte levels to make sure this is not contributing to the eye. -Follow-up with your eye doctor in 5 days if symptoms persist, or sooner if symptoms worsen..  Seek additional immediate medical attention if your symptoms worsen, like vision changes, vision loss, eye redness, eye discharge.

## 2021-01-20 NOTE — ED Provider Notes (Signed)
[facility]    CSN: [id number] Arrival date & time: 01/20/21  1737      History   Chief Complaint Chief Complaint  Patient presents with  . Eye Twitching    HPI [name] is a 45 y.o. female presenting with 2 weeks of progressively worsening L upper eyelid twitching. Some tenderness of upper eyelid when twitching is at its worst. States the lid twitches throughout the day but is worst when applying makeup. She states she does have a stressful job but does not feel anxious. Practices self care and meditation. Denies increase in stressors. Denies muscular twitching elsewhere. Denies vision changes, eye pain, eye redness, eye discharge, trauma.  She wears transitional lenses, these were prescribed 1 year ago.  Has not followed with her eye doctor since then. Denies weakness in arms or legs, dizziness, headaches, facial droop.  HPI  History reviewed. No pertinent past medical history.  There are no problems to display for this patient.   History reviewed. No pertinent surgical history.  OB History   No obstetric history on file.      Home Medications    Prior to Admission medications   Medication Sig Start Date End Date Taking? Authorizing Provider  tizanidine (ZANAFLEX) 2 MG capsule Take 1 capsule (2 mg total) by mouth 3 (three) times daily. 01/20/21  Yes Rhys Martini, PA-C  silver sulfADIAZINE (SILVADENE) 1 % cream Apply 1 application topically daily. 05/02/17   Dorena Bodo, NP    Family History Family History  Problem Relation Age of Onset  . Healthy Mother   . Healthy Father     Social History Social History   Tobacco Use  . Smoking status: Current Every Day Smoker    Types: Cigarettes  . Smokeless tobacco: Never Used  Substance Use Topics  . Alcohol use: Yes  . Drug use: No     Allergies   Patient has no known allergies.   Review of Systems Review of Systems  HENT:       L upper eyelid twitching  All other systems  reviewed and are negative.    Physical Exam Triage Vital Signs ED Triage Vitals [01/20/21 1901]  Enc Vitals Group     BP 131/79     Pulse Rate 82     Resp 18     Temp 98.7 F (37.1 C)     Temp Source Oral     SpO2 98 %     Weight      Height      Head Circumference      Peak Flow      Pain Score 0     Pain Loc      Pain Edu?      Excl. in GC?    No data found.  Updated Vital Signs BP 131/79 (BP Location: Left Arm)   Pulse 82   Temp 98.7 F (37.1 C) (Oral)   Resp 18   LMP 01/06/2021   SpO2 98%   Visual Acuity Right Eye Distance:   Left Eye Distance:   Bilateral Distance:    Right Eye Near:   Left Eye Near:    Bilateral Near:     Physical Exam Vitals reviewed.  Constitutional:      General: She is not in acute distress.    Appearance: Normal appearance. She is not ill-appearing or diaphoretic.  HENT:     Head: Normocephalic and atraumatic.  Eyes:     General: Lids  are everted, no foreign bodies appreciated. Vision grossly intact. Gaze aligned appropriately. No visual field deficit.       Right eye: No foreign body, discharge or hordeolum.        Left eye: No foreign body, discharge or hordeolum.     Extraocular Movements: Extraocular movements intact.     Right eye: Normal extraocular motion and no nystagmus.     Left eye: Normal extraocular motion and no nystagmus.     Conjunctiva/sclera:     Right eye: Right conjunctiva is not injected. No chemosis, exudate or hemorrhage.    Left eye: Left conjunctiva is not injected. No chemosis, exudate or hemorrhage.    Pupils: Pupils are equal, round, and reactive to light.     Comments: Visual acuity intact L upper eyelid occasionally with slight twitches, worst over medial aspect. No eyelid swelling, tenderness, deformity, injury. No conjunctival injection. PERRLA, EOMI and without pain.  Cardiovascular:     Rate and Rhythm: Normal rate and regular rhythm.     Heart sounds: Normal heart sounds.  Pulmonary:      Effort: Pulmonary effort is normal.     Breath sounds: Normal breath sounds.  Skin:    General: Skin is warm.  Neurological:     General: No focal deficit present.     Mental Status: She is alert and oriented to person, place, and time.     Cranial Nerves: Cranial nerves are intact. No cranial nerve deficit.     Sensory: Sensation is intact.     Motor: Motor function is intact. No weakness.     Coordination: Coordination is intact.     Gait: Gait is intact. Gait normal.     Comments: CN 2-12 grossly intact  Psychiatric:        Mood and Affect: Mood normal.        Behavior: Behavior normal.        Thought Content: Thought content normal.        Judgment: Judgment normal.      UC Treatments / Results  Labs (all labs ordered are listed, but only abnormal results are displayed) Labs Reviewed  BASIC METABOLIC PANEL - Abnormal; Notable for the following components:      Result Value   CO2 18 (*)    All other components within normal limits   Narrative:    Performed at:  [address], [place], [place]  [id number] Lab Director: Jolene Schimke MD, Phone:  [phone]    EKG   Radiology No results found.  Procedures Procedures (including critical care time)  Medications Ordered in UC Medications - No data to display  Initial Impression / Assessment and Plan / UC Course  I have reviewed the triage vital signs and the nursing notes.  Pertinent labs & imaging results that were available during my care of the patient were reviewed by me and considered in my medical decision making (see chart for details).     This patient is a 45 year old female presenting with L upper eyelid twitching. Fairly benign exam including neuro exam. Visual acuity intact. Pt wears transition lenses. Last visit with optometry was 1 year ago. We will check a BMP to rule out electrolyte abnormalities. This was normal.  Trial of Zanaflex. Follow-up with optho if symptoms  persist. ED return precautions discussed  Final Clinical Impressions(s) / UC Diagnoses   Final diagnoses:  Eyelid twitch     Discharge Instructions     -Start the  muscle relaxer-Zanaflex (tizanidine), up to 3 times daily for muscle spasms and pain.  This can make you drowsy, so take at bedtime or when you do not need to drive or operate machinery. -We are checking your electrolyte levels to make sure this is not contributing to the eye. -Follow-up with your eye doctor in 5 days if symptoms persist, or sooner if symptoms worsen..  Seek additional immediate medical attention if your symptoms worsen, like vision changes, vision loss, eye redness, eye discharge.    ED Prescriptions    Medication Sig Dispense Auth. Provider   tizanidine (ZANAFLEX) 2 MG capsule Take 1 capsule (2 mg total) by mouth 3 (three) times daily. 21 capsule Rhys Martini, PA-C     PDMP not reviewed this encounter.   Rhys Martini, PA-C 01/21/21 [phone]

## 2021-01-21 LAB — BASIC METABOLIC PANEL
BUN/Creatinine Ratio: 11 (ref 9–23)
BUN: 10 mg/dL (ref 6–24)
CO2: 18 mmol/L — ABNORMAL LOW (ref 20–29)
Calcium: 9.2 mg/dL (ref 8.7–10.2)
Chloride: 103 mmol/L (ref 96–106)
Creatinine, Ser: 0.91 mg/dL (ref 0.57–1.00)
Glucose: 89 mg/dL (ref 65–99)
Potassium: 4.2 mmol/L (ref 3.5–5.2)
Sodium: 137 mmol/L (ref 134–144)
eGFR: 78 mL/min/{1.73_m2} (ref >59)

## 2021-02-10 ENCOUNTER — Ambulatory Visit (HOSPITAL_COMMUNITY)
Admission: EM | Admit: 2021-02-10 | Discharge: 2021-02-10 | Disposition: A | Discharge status: Home / Self Care | Payer: Self-pay | Attending: Family Medicine | Admitting: Family Medicine

## 2021-02-10 ENCOUNTER — Other Ambulatory Visit: Payer: Self-pay

## 2021-02-10 ENCOUNTER — Encounter (HOSPITAL_COMMUNITY): Payer: Self-pay

## 2021-02-10 DIAGNOSIS — N390 Urinary tract infection, site not specified: Secondary | ICD-10-CM | POA: Insufficient documentation

## 2021-02-10 LAB — POCT URINALYSIS DIPSTICK, ED / UC
Bilirubin Urine: NEGATIVE
Glucose, UA: NEGATIVE mg/dL
Ketones, ur: NEGATIVE mg/dL
Leukocytes,Ua: NEGATIVE
Nitrite: POSITIVE — AB
Protein, ur: NEGATIVE mg/dL
Specific Gravity, Urine: 1.02 (ref 1.005–1.030)
Urobilinogen, UA: 0.2 mg/dL (ref 0.0–1.0)
pH: 6.5 (ref 5.0–8.0)

## 2021-02-10 MED ORDER — SULFAMETHOXAZOLE-TRIMETHOPRIM 800-160 MG PO TABS: 1.0000 | ORAL_TABLET | Freq: Two times a day (BID) | ORAL | 0 refills | Status: DC

## 2021-02-10 NOTE — ED Provider Notes (Signed)
[facility]    CSN: [id number] Arrival date & time: 02/10/21  1734      History   Chief Complaint Chief Complaint  Patient presents with  . UTI     HPI [name] is a 45 y.o. female.   Patient presenting today with about a month of off-and-on malodorous urine.  States she was treated for urinary tract infection around the time of onset but she did not finish the course of medication and feels like it never fully resolved.  Denies fever, chills, abdominal pain, flank pain, pelvic pain, vaginal symptoms.  Not trying anything over-the-counter for symptoms thus far.     History reviewed. No pertinent past medical history.  There are no problems to display for this patient.   History reviewed. No pertinent surgical history.  OB History   No obstetric history on file.      Home Medications    Prior to Admission medications   Medication Sig Start Date End Date Taking? Authorizing Provider  sulfamethoxazole-trimethoprim (BACTRIM DS) 800-160 MG tablet Take 1 tablet by mouth 2 (two) times daily. 02/10/21  Yes Particia Nearing, PA-C  silver sulfADIAZINE (SILVADENE) 1 % cream Apply 1 application topically daily. 05/02/17   Dorena Bodo, NP  tizanidine (ZANAFLEX) 2 MG capsule Take 1 capsule (2 mg total) by mouth 3 (three) times daily. 01/20/21   Rhys Martini, PA-C    Family History Family History  Problem Relation Age of Onset  . Healthy Mother   . Healthy Father     Social History Social History   Tobacco Use  . Smoking status: Current Every Day Smoker    Types: Cigarettes  . Smokeless tobacco: Never Used  Substance Use Topics  . Alcohol use: Yes  . Drug use: No     Allergies   Patient has no known allergies.   Review of Systems Review of Systems HPI  Physical Exam Triage Vital Signs ED Triage Vitals  Enc Vitals Group     BP 02/10/21 1827 117/66     Pulse Rate 02/10/21 1827 90     Resp 02/10/21 1827 18     Temp 02/10/21  1827 98.1 F (36.7 C)     Temp Source 02/10/21 1827 Oral     SpO2 02/10/21 1827 96 %     Weight --      Height --      Head Circumference --      Peak Flow --      Pain Score 02/10/21 1832 0     Pain Loc --      Pain Edu? --      Excl. in GC? --    No data found.  Updated Vital Signs BP 117/66 (BP Location: Right Arm)   Pulse 90   Temp 98.1 F (36.7 C) (Oral)   Resp 18   LMP 02/04/2021   SpO2 96%   Visual Acuity Right Eye Distance:   Left Eye Distance:   Bilateral Distance:    Right Eye Near:   Left Eye Near:    Bilateral Near:     Physical Exam Vitals and nursing note reviewed.  Constitutional:      Appearance: Normal appearance. She is not ill-appearing.  HENT:     Head: Atraumatic.  Eyes:     Extraocular Movements: Extraocular movements intact.     Conjunctiva/sclera: Conjunctivae normal.  Cardiovascular:     Rate and Rhythm: Normal rate and regular rhythm.  Heart sounds: Normal heart sounds.  Pulmonary:     Effort: Pulmonary effort is normal.     Breath sounds: Normal breath sounds.  Abdominal:     General: Bowel sounds are normal. There is no distension.     Palpations: Abdomen is soft.     Tenderness: There is no abdominal tenderness. There is no right CVA tenderness, left CVA tenderness or guarding.  Musculoskeletal:        General: Normal range of motion.     Cervical back: Normal range of motion and neck supple.  Skin:    General: Skin is warm and dry.  Neurological:     Mental Status: She is alert and oriented to person, place, and time.  Psychiatric:        Mood and Affect: Mood normal.        Thought Content: Thought content normal.        Judgment: Judgment normal.      UC Treatments / Results  Labs (all labs ordered are listed, but only abnormal results are displayed) Labs Reviewed  POCT URINALYSIS DIPSTICK, ED / UC - Abnormal; Notable for the following components:      Result Value   Hgb urine dipstick SMALL (*)    Nitrite  POSITIVE (*)    All other components within normal limits  URINE CULTURE    EKG   Radiology No results found.  Procedures Procedures (including critical care time)  Medications Ordered in UC Medications - No data to display  Initial Impression / Assessment and Plan / UC Course  I have reviewed the triage vital signs and the nursing notes.  Pertinent labs & imaging results that were available during my care of the patient were reviewed by me and considered in my medical decision making (see chart for details).     UA today with evidence of possible UTI, culture pending.  Will start Bactrim while awaiting culture results and adjust if needed based on these results.  Discussed increasing fluid intake, probiotics, other supportive home measures.  Follow-up for acutely worsening symptoms.  Final Clinical Impressions(s) / UC Diagnoses   Final diagnoses:  Acute lower UTI   Discharge Instructions   None    ED Prescriptions    Medication Sig Dispense Auth. Provider   sulfamethoxazole-trimethoprim (BACTRIM DS) 800-160 MG tablet Take 1 tablet by mouth 2 (two) times daily. 6 tablet Particia Nearing, [place]     PDMP not reviewed this encounter.   Particia Nearing, [place] 02/10/21 1925

## 2021-02-10 NOTE — ED Triage Notes (Signed)
Pt presents for follow up ; pt was treated for UTI over a month ago but did not complete medication and still having discomfort & urine odor.

## 2021-02-13 LAB — URINE CULTURE: Culture: >=100000 — AB

## 2022-05-23 ENCOUNTER — Ambulatory Visit
Admission: EM | Admit: 2022-05-23 | Discharge: 2022-05-23 | Disposition: A | Discharge status: Home / Self Care | Payer: Self-pay | Attending: Family Medicine | Admitting: Family Medicine

## 2022-05-23 ENCOUNTER — Encounter: Payer: Self-pay | Admitting: Emergency Medicine

## 2022-05-23 ENCOUNTER — Other Ambulatory Visit: Payer: Self-pay

## 2022-05-23 DIAGNOSIS — N390 Urinary tract infection, site not specified: Secondary | ICD-10-CM | POA: Insufficient documentation

## 2022-05-23 LAB — POCT URINALYSIS DIP (MANUAL ENTRY)
Bilirubin, UA: NEGATIVE
Glucose, UA: NEGATIVE mg/dL
Ketones, POC UA: NEGATIVE mg/dL
Nitrite, UA: NEGATIVE
Protein Ur, POC: NEGATIVE mg/dL
Spec Grav, UA: 1.025 (ref 1.010–1.025)
Urobilinogen, UA: 0.2 E.U./dL
pH, UA: 6.5 (ref 5.0–8.0)

## 2022-05-23 MED ORDER — CEPHALEXIN 500 MG PO CAPS: 500.0000 mg | ORAL_CAPSULE | Freq: Two times a day (BID) | ORAL | 0 refills | Status: DC

## 2022-05-23 NOTE — ED Triage Notes (Signed)
Pt here for dysuria x 2 weeks since starting during her cycle but continuing

## 2022-05-23 NOTE — ED Provider Notes (Signed)
[facility]    CSN: [id number] Arrival date & time: 05/23/22  1402      History   Chief Complaint Chief Complaint  Patient presents with   Dysuria    HPI [name] is a 46 y.o. female.   Pt here for dysuria x 2 weeks since starting during her cycle but continuing       History reviewed. No pertinent past medical history.  There are no problems to display for this patient.   History reviewed. No pertinent surgical history.  OB History   No obstetric history on file.      Home Medications    Prior to Admission medications   Medication Sig Start Date End Date Taking? Authorizing Provider  cephALEXin (KEFLEX) 500 MG capsule Take 1 capsule (500 mg total) by mouth 2 (two) times daily. 05/23/22  Yes Volney American, PA-C  silver sulfADIAZINE (SILVADENE) 1 % cream Apply 1 application topically daily. 05/02/17   Barnet Glasgow, NP  sulfamethoxazole-trimethoprim (BACTRIM DS) 800-160 MG tablet Take 1 tablet by mouth 2 (two) times daily. Patient not taking: Reported on 05/23/2022 02/10/21   Volney American, PA-C  tizanidine (ZANAFLEX) 2 MG capsule Take 1 capsule (2 mg total) by mouth 3 (three) times daily. 01/20/21   Hazel Sams, PA-C    Family History Family History  Problem Relation Age of Onset   Healthy Mother    Healthy Father     Social History Social History   Tobacco Use   Smoking status: Every Day    Types: Cigarettes   Smokeless tobacco: Never  Substance Use Topics   Alcohol use: Yes   Drug use: No     Allergies   Patient has no known allergies.   Review of Systems Review of Systems PER HPI  Physical Exam Triage Vital Signs ED Triage Vitals  Enc Vitals Group     BP 05/23/22 1417 121/60     Pulse Rate 05/23/22 1417 75     Resp 05/23/22 1417 18     Temp 05/23/22 1417 98.4 F (36.9 C)     Temp Source 05/23/22 1417 Oral     SpO2 05/23/22 1417 98 %     Weight --      Height --      Head Circumference  --      Peak Flow --      Pain Score 05/23/22 1427 2     Pain Loc --      Pain Edu? --      Excl. in [place]? --    No data found.  Updated Vital Signs BP 121/60 (BP Location: Left Arm)   Pulse 75   Temp 98.4 F (36.9 C) (Oral)   Resp 18   SpO2 98%   Visual Acuity Right Eye Distance:   Left Eye Distance:   Bilateral Distance:    Right Eye Near:   Left Eye Near:    Bilateral Near:     Physical Exam Vitals and nursing note reviewed.  Constitutional:      Appearance: Normal appearance. She is not ill-appearing.  HENT:     Head: Atraumatic.     Mouth/Throat:     Mouth: Mucous membranes are moist.  Eyes:     Extraocular Movements: Extraocular movements intact.     Conjunctiva/sclera: Conjunctivae normal.  Cardiovascular:     Rate and Rhythm: Normal rate and regular rhythm.     Heart sounds: Normal heart sounds.  Pulmonary:  Effort: Pulmonary effort is normal.     Breath sounds: Normal breath sounds.  Abdominal:     General: Bowel sounds are normal. There is no distension.     Palpations: Abdomen is soft.     Tenderness: There is no abdominal tenderness. There is no right CVA tenderness, left CVA tenderness or guarding.  Musculoskeletal:        General: Normal range of motion.     Cervical back: Normal range of motion and neck supple.  Skin:    General: Skin is warm and dry.  Neurological:     Mental Status: She is alert and oriented to person, place, and time.     Motor: No weakness.     Gait: Gait normal.  Psychiatric:        Mood and Affect: Mood normal.        Thought Content: Thought content normal.        Judgment: Judgment normal.    UC Treatments / Results  Labs (all labs ordered are listed, but only abnormal results are displayed) Labs Reviewed  POCT URINALYSIS DIP (MANUAL ENTRY) - Abnormal; Notable for the following components:      Result Value   Clarity, UA cloudy (*)    Blood, UA small (*)    Leukocytes, UA Trace (*)    All other components  within normal limits  URINE CULTURE    EKG   Radiology No results found.  Procedures Procedures (including critical care time)  Medications Ordered in UC Medications - No data to display  Initial Impression / Assessment and Plan / UC Course  I have reviewed the triage vital signs and the nursing notes.  Pertinent labs & imaging results that were available during my care of the patient were reviewed by me and considered in my medical decision making (see chart for details).     Vital signs and exam benign and reassuring today, urinalysis with possible evidence of urinary tract infection.  Treat with Keflex, await urine culture.  She declines a vaginal swab today.  Final Clinical Impressions(s) / UC Diagnoses   Final diagnoses:  Acute lower UTI   Discharge Instructions   None    ED Prescriptions     Medication Sig Dispense Auth. Provider   cephALEXin (KEFLEX) 500 MG capsule Take 1 capsule (500 mg total) by mouth 2 (two) times daily. 10 capsule Volney American, [place]      PDMP not reviewed this encounter.   Volney American, [place] 05/23/22 1446

## 2022-05-25 LAB — URINE CULTURE: Culture: 10000 — AB

## 2023-01-04 ENCOUNTER — Ambulatory Visit (INDEPENDENT_AMBULATORY_CARE_PROVIDER_SITE_OTHER): Payer: Self-pay

## 2023-01-04 ENCOUNTER — Ambulatory Visit
Admission: EM | Admit: 2023-01-04 | Discharge: 2023-01-04 | Disposition: A | Discharge status: Home / Self Care | Payer: Self-pay | Attending: Nurse Practitioner | Admitting: Nurse Practitioner

## 2023-01-04 DIAGNOSIS — K5900 Constipation, unspecified: Secondary | ICD-10-CM

## 2023-01-04 DIAGNOSIS — R102 Pelvic and perineal pain: Secondary | ICD-10-CM

## 2023-01-04 LAB — POCT URINALYSIS DIP (MANUAL ENTRY)
Bilirubin, UA: NEGATIVE
Glucose, UA: NEGATIVE mg/dL
Ketones, POC UA: NEGATIVE mg/dL
Leukocytes, UA: NEGATIVE
Nitrite, UA: NEGATIVE
Protein Ur, POC: NEGATIVE mg/dL
Spec Grav, UA: >=1.03 — AB (ref 1.010–1.025)
Urobilinogen, UA: 0.2 E.U./dL
pH, UA: 6 (ref 5.0–8.0)

## 2023-01-04 NOTE — ED Triage Notes (Signed)
Pt present lower abdomen pain, symptoms started two weeks ago. Pt states not having bowel movements frequently.

## 2023-01-04 NOTE — Discharge Instructions (Addendum)
Your urinalysis is essentially negative with just trace hematuria.  Your abdominal x-ray does indicate a large stool burden suggesting constipation.  The recommendation for constipation is to take MiraLAX as directed.  I do understand that you like to use natural remedies.  I do encourage patient to increase water intake and exercise helps with constipation.

## 2023-01-04 NOTE — ED Provider Notes (Signed)
[facility]    CSN: [id number] Arrival date & time: 01/04/23  1557      History   Chief Complaint Chief Complaint  Patient presents with   Abdominal Pain    HPI [name] is a 47 y.o. female.   HPI  She is in today with abdominal pain.  She reports that she is having some bloating.  She denies a history of constipation but reports that this has been going on for several weeks.  She does try to do natural remedies, beet and pineapple juice. She denies any nausea, vomiting.  She denies dysuria.  But she is having some pressure however not with urination.    History reviewed. No pertinent past medical history.  There are no problems to display for this patient.   History reviewed. No pertinent surgical history.  OB History   No obstetric history on file.      Home Medications    Prior to Admission medications   Medication Sig Start Date End Date Taking? Authorizing Provider  silver sulfADIAZINE (SILVADENE) 1 % cream Apply 1 application topically daily. 05/02/17   Dorena Bodo, NP  tizanidine (ZANAFLEX) 2 MG capsule Take 1 capsule (2 mg total) by mouth 3 (three) times daily. 01/20/21   Rhys Martini, PA-C    Family History Family History  Problem Relation Age of Onset   Healthy Mother    Healthy Father     Social History Social History   Tobacco Use   Smoking status: Every Day    Types: Cigarettes   Smokeless tobacco: Never  Substance Use Topics   Alcohol use: Yes   Drug use: No     Allergies   Patient has no known allergies.   Review of Systems Review of Systems   Physical Exam Triage Vital Signs ED Triage Vitals [01/04/23 1610]  Enc Vitals Group     BP 114/73     Pulse Rate 75     Resp 18     Temp 98.4 F (36.9 C)     Temp Source Oral     SpO2 94 %     Weight      Height      Head Circumference      Peak Flow      Pain Score 0     Pain Loc      Pain Edu?      Excl. in GC?    No data found.  Updated  Vital Signs BP 114/73 (BP Location: Left Arm)   Pulse 75   Temp 98.4 F (36.9 C) (Oral)   Resp 18   LMP 12/20/2022   SpO2 94%   Visual Acuity Right Eye Distance:   Left Eye Distance:   Bilateral Distance:    Right Eye Near:   Left Eye Near:    Bilateral Near:     Physical Exam HENT:     Head: Normocephalic.  Cardiovascular:     Rate and Rhythm: Normal rate.  Pulmonary:     Effort: Pulmonary effort is normal.  Skin:    General: Skin is warm and dry.     Capillary Refill: Capillary refill takes less than 2 seconds.  Neurological:     General: No focal deficit present.     Mental Status: She is alert and oriented to person, place, and time.      UC Treatments / Results  Labs (all labs ordered are listed, but only abnormal results are displayed)  Labs Reviewed  POCT URINALYSIS DIP (MANUAL ENTRY) - Abnormal; Notable for the following components:      Result Value   Clarity, UA hazy (*)    Spec Grav, UA >=1.030 (*)    Blood, UA trace-intact (*)    All other components within normal limits    EKG   Radiology DG Abd 1 View  Result Date: 01/04/2023 CLINICAL DATA:  Provided history: Abdominal pain. Pelvic pain. EXAM: ABDOMEN - 1 VIEW COMPARISON:  Chest radiograph 05/13/2010. FINDINGS: Mild nonspecific gaseous distention of the stomach. No dilated loops of small bowel are demonstrated to suggest small bowel obstruction. Fairly large stool burden, greatest within the right colon and within the rectum. No acute osseous abnormality identified. IMPRESSION: 1. Mild, nonspecific gaseous distention of the stomach. 2. No dilated loops of small bowel to suggest small bowel obstruction. 3. Fairly large stool burden (greatest within the right colon and rectum) suggesting constipation. Electronically Signed   By: Jackey Loge D.O.   On: 01/04/2023 16:50    Procedures Procedures (including critical care time)  Medications Ordered in UC Medications - No data to display  Initial  Impression / Assessment and Plan / UC Course  I have reviewed the triage vital signs and the nursing notes.  Pertinent labs & imaging results that were available during my care of the patient were reviewed by me and considered in my medical decision making (see chart for details).     Abdominal pain and pelvic pressure Final Clinical Impressions(s) / UC Diagnoses   Final diagnoses:  Pelvic pain  Constipation, unspecified constipation type     Discharge Instructions      Your urinalysis is essentially negative with just trace hematuria.  Your abdominal x-ray does indicate a large stool burden suggesting constipation.  The recommendation for constipation is to take MiraLAX as directed.  I do understand that you like to use natural remedies.  I do encourage patient to increase water intake and exercise helps with constipation.     ED Prescriptions   None    PDMP not reviewed this encounter.   Thad Ranger [place], [place] 01/04/23 [phone]

## 2024-02-01 ENCOUNTER — Ambulatory Visit
Admission: EM | Admit: 2024-02-01 | Discharge: 2024-02-01 | Disposition: A | Discharge status: Home / Self Care | Attending: Family Medicine | Admitting: Family Medicine

## 2024-02-01 DIAGNOSIS — R102 Pelvic and perineal pain: Secondary | ICD-10-CM | POA: Insufficient documentation

## 2024-02-01 LAB — POCT URINALYSIS DIP (MANUAL ENTRY)
Bilirubin, UA: NEGATIVE
Glucose, UA: NEGATIVE mg/dL
Ketones, POC UA: NEGATIVE mg/dL
Nitrite, UA: NEGATIVE
Protein Ur, POC: NEGATIVE mg/dL
Spec Grav, UA: 1.015 (ref 1.010–1.025)
Urobilinogen, UA: 0.2 U/dL
pH, UA: 6 (ref 5.0–8.0)

## 2024-02-01 MED ORDER — DOCUSATE SODIUM 100 MG PO CAPS: 100.0000 mg | ORAL_CAPSULE | Freq: Two times a day (BID) | ORAL | 0 refills | Status: AC

## 2024-02-01 NOTE — Discharge Instructions (Addendum)
 We will let you know about your urine culture results and if you end up needing an antibiotic for this. For moderate to severe constipation (not having a bowel movement in more than 3 days) then try to use an enema or Miralax once daily until you have a good bowel movement.  It is not a good idea to use an enema or laxatives daily. If you find you are doing this, then please follow up with a gastroenterologist. Otherwise, a medication you could use daily to help with promoting bowel movements is docusate (Colace) 100mg . It is okay to use this 1-2 times daily as a stool softener.  Try to stay active physically including regular exercise 2-3 times a week.  Make sure you hydrate well every day with about 64 ounces of water daily (that is 2 liters).  Try to avoid carb heavy foods, dairy. This includes cutting out breads, pasta, pizza, pastries, potatoes, rice, starchy foods in general. Try avoid meat products. Eat more fiber as listed below:  Salads - kale, spinach, cabbage, spring mix, arugula Fruits - avocadoes, berries (blueberries, raspberries, blackberries), apples, oranges, pomegranate, grapefruit, kiwi Vegetables - asparagus, cauliflower, broccoli, Flicker beans, brussel sprouts, bell peppers, beets; stay away from or limit starchy vegetables like potatoes, carrots, peas Other general foods - kidney beans, egg whites, almonds, walnuts, sunflower seeds, pumpkin seeds, fat free yogurt, almond milk, flax seeds, quinoa, oats   DO NOT EAT ANY FOODS ON THIS LIST THAT YOU ARE ALLERGIC TO. For more specific needs, I highly recommend consulting a dietician or nutritionist but this can definitely be a good starting point.

## 2024-02-01 NOTE — ED Triage Notes (Signed)
 Pt c/o intermittent sharp generalized abd pain x 2 weeks-has not sought medical tx until today-NAD-steady gait

## 2024-02-01 NOTE — ED Provider Notes (Signed)
 [facility]  Note:  This document was prepared using Conservation officer, historic buildings and may include unintentional dictation errors.  MRN: [id number] DOB: 12-08-1975  Subjective:   [name] is a 48 y.o. female presenting for 2-week history of intermittent lower abdominal pain, pelvic pains.  Patient is concerned that this may be related to another bout of constipation.  Has been having difficulty again having bowel movements every 2 to 3 days.  Denies fever, n/v, abdominal pain, pelvic pain, rashes, dysuria, urinary frequency, hematuria, vaginal discharge, loose stools, bloody stools.  Has longstanding history of hematuria but is unchanged for the patient.  No current facility-administered medications for this encounter.  Current Outpatient Medications:    silver  sulfADIAZINE  (SILVADENE ) 1 % cream, Apply 1 application topically daily., Disp: 50 g, Rfl: 0   tizanidine  (ZANAFLEX ) 2 MG capsule, Take 1 capsule (2 mg total) by mouth 3 (three) times daily., Disp: 21 capsule, Rfl: 0   No Known Allergies  History reviewed. No pertinent past medical history.   History reviewed. No pertinent surgical history.  Family History  Problem Relation Age of Onset   Healthy Mother    Healthy Father     Social History   Tobacco Use   Smoking status: Former    Types: Cigarettes   Smokeless tobacco: Never  Vaping Use   Vaping status: Every Day  Substance Use Topics   Alcohol use: Yes    Comment: occ   Drug use: No    ROS   Objective:   Vitals: BP 115/76 (BP Location: Right Arm)   Pulse 85   Temp 99.9 F (37.7 C) (Oral)   Resp 20   LMP 01/15/2024   SpO2 97%   Physical Exam Constitutional:      General: She is not in acute distress.    Appearance: Normal appearance. She is well-developed. She is not ill-appearing, toxic-appearing or diaphoretic.  HENT:     Head: Normocephalic and atraumatic.     Nose: Nose normal.     Mouth/Throat:      Mouth: Mucous membranes are moist.     Pharynx: Oropharynx is clear.  Eyes:     General: No scleral icterus.       Right eye: No discharge.        Left eye: No discharge.     Extraocular Movements: Extraocular movements intact.     Conjunctiva/sclera: Conjunctivae normal.  Cardiovascular:     Rate and Rhythm: Normal rate.  Pulmonary:     Effort: Pulmonary effort is normal.  Abdominal:     General: Bowel sounds are normal. There is no distension.     Palpations: Abdomen is soft. There is no mass.     Tenderness: There is no abdominal tenderness. There is no right CVA tenderness, left CVA tenderness, guarding or rebound.  Skin:    General: Skin is warm and dry.  Neurological:     General: No focal deficit present.     Mental Status: She is alert and oriented to person, place, and time.  Psychiatric:        Mood and Affect: Mood normal.        Behavior: Behavior normal.        Thought Content: Thought content normal.        Judgment: Judgment normal.     Results for orders placed or performed during the hospital encounter of 02/01/24 (from the past 24 hours)  POCT urinalysis dipstick  Status: Abnormal   Collection Time: 02/01/24  6:08 PM  Result Value Ref Range   Color, UA yellow yellow   Clarity, UA hazy (A) clear   Glucose, UA negative negative mg/dL   Bilirubin, UA negative negative   Ketones, POC UA negative negative mg/dL   Spec Grav, UA 1.610 9.604 - 1.025   Blood, UA small (A) negative   pH, UA 6.0 5.0 - 8.0   Protein Ur, POC negative negative mg/dL   Urobilinogen, UA 0.2 0.2 or 1.0 E.U./dL   Nitrite, UA Negative Negative   Leukocytes, UA Trace (A) Negative    Assessment and Plan :   PDMP not reviewed this encounter.  1. Suprapubic pain    Urine culture pending for suprapubic pain.  I would recommend treating should she test positive off of the urine culture.  No signs of an acute abdomen.  Patient hemodynamically stable and can be treated as an outpatient.   Recommend starting docusate, making significant dietary changes.  Offered imaging but both the patient and I agreed to defer for now.  Counseled patient on potential for adverse effects with medications prescribed/recommended today, ER and return-to-clinic precautions discussed, patient verbalized understanding.    Adolph Hoop, [place] 02/01/24 [id number]

## 2024-02-04 LAB — URINE CULTURE: Culture: 30000 — AB

## 2024-02-06 ENCOUNTER — Ambulatory Visit (HOSPITAL_COMMUNITY): Payer: Self-pay

## 2024-02-06 MED ORDER — NITROFURANTOIN MONOHYD MACRO 100 MG PO CAPS: 100.0000 mg | ORAL_CAPSULE | Freq: Two times a day (BID) | ORAL | 0 refills | Status: AC
